# Patient Record
Sex: Female | Born: 1995 | Race: White | Hispanic: No | Marital: Single | Smoking: Never smoker
Health system: Southern US, Community
[De-identification: ages and names within clinical notes are randomized; demographics above are authoritative.]

## PROBLEM LIST (undated history)

## (undated) DIAGNOSIS — G930 Cerebral cysts: Secondary | ICD-10-CM

## (undated) DIAGNOSIS — R55 Syncope and collapse: Secondary | ICD-10-CM

## (undated) DIAGNOSIS — Z91410 Personal history of adult physical and sexual abuse: Secondary | ICD-10-CM

## (undated) DIAGNOSIS — J45909 Unspecified asthma, uncomplicated: Secondary | ICD-10-CM

## (undated) DIAGNOSIS — Z62810 Personal history of physical and sexual abuse in childhood: Secondary | ICD-10-CM

## (undated) DIAGNOSIS — G43109 Migraine with aura, not intractable, without status migrainosus: Secondary | ICD-10-CM

## (undated) HISTORY — PX: WISDOM TOOTH EXTRACTION: SHX21

## (undated) HISTORY — DX: Personal history of adult physical and sexual abuse: Z91.410

## (undated) HISTORY — DX: Migraine with aura, not intractable, without status migrainosus: G43.109

## (undated) HISTORY — DX: Cerebral cysts: G93.0

## (undated) HISTORY — DX: Personal history of physical and sexual abuse in childhood: Z62.810

---

## 2017-09-21 ENCOUNTER — Emergency Department (HOSPITAL_COMMUNITY)
Admission: EM | Admit: 2017-09-21 | Discharge: 2017-09-21 | Disposition: A | Discharge status: Home / Self Care | Attending: Emergency Medicine | Admitting: Emergency Medicine

## 2017-09-21 ENCOUNTER — Encounter (HOSPITAL_COMMUNITY): Payer: Self-pay

## 2017-09-21 DIAGNOSIS — R51 Headache: Secondary | ICD-10-CM | POA: Diagnosis not present

## 2017-09-21 DIAGNOSIS — J45909 Unspecified asthma, uncomplicated: Secondary | ICD-10-CM | POA: Insufficient documentation

## 2017-09-21 DIAGNOSIS — R202 Paresthesia of skin: Secondary | ICD-10-CM | POA: Diagnosis not present

## 2017-09-21 HISTORY — DX: Syncope and collapse: R55

## 2017-09-21 HISTORY — DX: Unspecified asthma, uncomplicated: J45.909

## 2017-09-21 LAB — COMPREHENSIVE METABOLIC PANEL
ALBUMIN: 3.8 g/dL (ref 3.5–5.0)
ALT: 17 U/L (ref 14–54)
AST: 26 U/L (ref 15–41)
Alkaline Phosphatase: 60 U/L (ref 38–126)
Anion gap: 7 (ref 5–15)
BUN: 12 mg/dL (ref 6–20)
CALCIUM: 9.1 mg/dL (ref 8.9–10.3)
CO2: 26 mmol/L (ref 22–32)
Chloride: 104 mmol/L (ref 101–111)
Creatinine, Ser: 0.77 mg/dL (ref 0.44–1.00)
GFR calc non Af Amer: >60 mL/min (ref >60)
GLUCOSE: 119 mg/dL — AB (ref 65–99)
POTASSIUM: 3.7 mmol/L (ref 3.5–5.1)
SODIUM: 137 mmol/L (ref 135–145)
Total Bilirubin: 0.9 mg/dL (ref 0.3–1.2)
Total Protein: 7 g/dL (ref 6.5–8.1)

## 2017-09-21 LAB — CBC WITH DIFFERENTIAL/PLATELET
BASOS PCT: 0 %
Basophils Absolute: 0 10*3/uL (ref 0.0–0.1)
EOS ABS: 0 10*3/uL (ref 0.0–0.7)
EOS PCT: 0 %
HCT: 38.5 % (ref 36.0–46.0)
Hemoglobin: 13.3 g/dL (ref 12.0–15.0)
LYMPHS ABS: 2.6 10*3/uL (ref 0.7–4.0)
Lymphocytes Relative: 30 %
MCH: 30.3 pg (ref 26.0–34.0)
MCHC: 34.5 g/dL (ref 30.0–36.0)
MCV: 87.7 fL (ref 78.0–100.0)
MONOS PCT: 8 %
Monocytes Absolute: 0.7 10*3/uL (ref 0.1–1.0)
NEUTROS PCT: 62 %
Neutro Abs: 5.3 10*3/uL (ref 1.7–7.7)
PLATELETS: 296 10*3/uL (ref 150–400)
RBC: 4.39 MIL/uL (ref 3.87–5.11)
RDW: 12.3 % (ref 11.5–15.5)
WBC: 8.6 10*3/uL (ref 4.0–10.5)

## 2017-09-21 LAB — I-STAT BETA HCG BLOOD, ED (MC, WL, AP ONLY): I-stat hCG, quantitative: <5 m[IU]/mL (ref <5)

## 2017-09-21 MED ORDER — DIPHENHYDRAMINE HCL 25 MG PO CAPS
ORAL_CAPSULE | ORAL | Status: AC
  Filled 2017-09-21: qty 1

## 2017-09-21 MED ORDER — HYDROMORPHONE HCL 2 MG/ML IJ SOLN
INTRAMUSCULAR | Status: AC
  Filled 2017-09-21: qty 1

## 2017-09-21 MED ORDER — KETOROLAC TROMETHAMINE 15 MG/ML IJ SOLN
INTRAMUSCULAR | Status: AC
  Filled 2017-09-21: qty 1

## 2017-09-21 NOTE — Discharge Instructions (Signed)
The cause for your numbness is not clear. It could be related to your headaches, hyperventilation, or Multiple Sclerosis. You should see a neurologist for further evaluation.

## 2017-09-21 NOTE — ED Notes (Signed)
Bed: [id number] Expected date:  Expected time:  Means of arrival:  Comments: Room 5

## 2017-09-21 NOTE — ED Triage Notes (Signed)
Pt presents with c/o right arm numbness and right side of the face numbness that has not resolved. Pt is tachycardic on arrival, 127 BPM on the monitor. Pt reports this has happened before but not to this extent. Pt is alert and oriented, no neuro deficits noted.

## 2017-09-21 NOTE — ED Provider Notes (Signed)
[facility]-EMERGENCY DEPT Provider Note   CSN: [id number] Arrival date & time: 09/21/17  0144     History   Chief Complaint Chief Complaint  Patient presents with  . Numbness    HPI Lindsey Benson is a 21 y.o. female.  The history is provided by the patient.  She has history of asthma and vasovagal syncope.  At 9 PM, she developed numbness in the right arm which spread up to involve the right side of her face.  Her fingers in the right hand started drawing together.  There is no numbness involving the right leg or anything on the left side.  Symptoms lasted about 15 minutes before resolving.  She did have a headache after symptoms resolved.  Currently all symptoms have completely resolved.  She has had 2 prior episodes and each of those was followed by a headache which is more typical of her migraine headaches.  The headache today was less severe than her usual headaches.  She does have history of panic attacks and she states she did have a panic attack about 1 hour before the episode tonight.  During that panic attacks she was hyperventilating.  This attack occurred while she was at rest and not agitated at all.  Past Medical History:  Diagnosis Date  . Asthma   . Vasovagal syncope     There are no active problems to display for this patient.   Past Surgical History:  Procedure Laterality Date  . WISDOM TOOTH EXTRACTION      OB History    No data available       Home Medications    Prior to Admission medications   Not on File    Family History History reviewed. No pertinent family history.  Social History Social History   Tobacco Use  . Smoking status: Never Smoker  . Smokeless tobacco: Never Used  Substance Use Topics  . Alcohol use: No    Frequency: Never  . Drug use: No     Allergies   Pork-derived products   Review of Systems Review of Systems  All other systems reviewed and are negative.    Physical Exam Updated  Vital Signs BP (!) 128/97 (BP Location: Left Arm)   Pulse (!) 120   Temp 98.1 F (36.7 C) (Oral)   Resp 13   Ht 5\' 4"  (1.626 m)   Wt 48.1 kg (106 lb)   LMP 08/21/2017 (Approximate)   SpO2 100%   BMI 18.19 kg/m   Physical Exam  Nursing note and vitals reviewed.  21 year old female, resting comfortably and in no acute distress. Vital signs are significant for tachycardia. Oxygen saturation is 100%, which is normal. Head is normocephalic and atraumatic. PERRLA, EOMI. Oropharynx is clear. Neck is nontender and supple without adenopathy or JVD.  There are no carotid bruits. Back is nontender and there is no CVA tenderness. Lungs are clear without rales, wheezes, or rhonchi. Chest is nontender. Heart has regular rate and rhythm without murmur. Abdomen is soft, flat, nontender without masses or hepatosplenomegaly and peristalsis is normoactive. Extremities have no cyanosis or edema, full range of motion is present. Skin is warm and dry without rash. Neurologic: Mental status is normal, cranial nerves are intact, there are no motor or sensory deficits.  ED Treatments / Results  Labs (all labs ordered are listed, but only abnormal results are displayed) Labs Reviewed  COMPREHENSIVE METABOLIC PANEL - Abnormal; Notable for the following components:  Result Value   Glucose, Bld 119 (*)    All other components within normal limits  CBC WITH DIFFERENTIAL/PLATELET  I-STAT BETA HCG BLOOD, ED ([facility], [facility], [facility] ONLY)    EKG  EKG Interpretation  Date/Time:  Saturday September 21 2017 02:03:34 EST Ventricular Rate:  123 PR Interval:    QRS Duration: 76 QT Interval:  297 QTC Calculation: 425 R Axis:   87 Text Interpretation:  Sinus tachycardia Consider right atrial enlargement Minimal ST depression, diffuse leads No old tracing to compare Confirmed by Dione BoozeGlick, Tyaire Odem ([id number]) on 09/21/2017 2:12:52 AM       Procedures Procedures (including critical care time)  Medications Ordered in  ED Medications - No data to display   Initial Impression / Assessment and Plan / ED Course  I have reviewed the triage vital signs and the nursing notes.  Pertinent labs & imaging results that were available during my care of the patient were reviewed by me and considered in my medical decision making (see chart for details).  Transient right-sided numbness with what sounds like carpopedal spasm.  This could be related to her headaches-essentially an aura of a migraine headache.  Consider possibility of occult hyperventilation.  Consider multiple sclerosis.  Patient has a friend with her who was also worried about transient ischemic attack.  I explained that this is very unlikely in a patient of this age with out other comorbidities.  She is a non-smoker and states that she has had an echocardiogram in the past which was normal and has had brain MRIs which were normal.  At this point, she is 6hours after resolution of symptoms and I do not see an indication for imaging today.  Will check screening labs and refer to neurology for outpatient workup which may need to include brain MRI with and without contrast.  She has no old records in the [facility].  Screening labs are unremarkable.  Heart rate is come back down to normal without any fluids or medication.  Patient is given reassurance and is referred to neurology for follow-up.  Her insurance is through [employer] and she has a Development worker, communityphysician in [place].  She states she may try to see a neurologist down there.  Final Clinical Impressions(s) / ED Diagnoses   Final diagnoses:  Paresthesia of right arm    ED Discharge Orders    None       Dione BoozeGlick, Rocio Roam, MD 09/21/17 [phone]

## 2017-09-21 NOTE — ED Notes (Signed)
ED Provider at bedside. 

## 2017-09-21 NOTE — ED Notes (Signed)
Bed: WA05 Expected date:  Expected time:  Means of arrival:  Comments: 

## 2017-09-21 NOTE — ED Notes (Signed)
Pt states she had 15 min of arm cramping and numbness and face numbness. Pt states symptoms resolved. No noted deficits at time of assessment. Pt states she had a panic attack secondary to stress from finals at school about an hour prior to symptoms. Pt states she did not have dinner, but she had a small lunch around 1500.

## 2018-01-10 ENCOUNTER — Emergency Department (HOSPITAL_COMMUNITY)

## 2018-01-10 ENCOUNTER — Emergency Department (HOSPITAL_COMMUNITY)
Admission: EM | Admit: 2018-01-10 | Discharge: 2018-01-10 | Disposition: A | Discharge status: Home / Self Care | Attending: Emergency Medicine | Admitting: Emergency Medicine

## 2018-01-10 ENCOUNTER — Encounter (HOSPITAL_COMMUNITY): Payer: Self-pay | Admitting: Emergency Medicine

## 2018-01-10 DIAGNOSIS — R202 Paresthesia of skin: Secondary | ICD-10-CM | POA: Insufficient documentation

## 2018-01-10 DIAGNOSIS — R200 Anesthesia of skin: Secondary | ICD-10-CM | POA: Diagnosis present

## 2018-01-10 LAB — I-STAT CHEM 8, ED
BUN: 11 mg/dL (ref 6–20)
CREATININE: 0.6 mg/dL (ref 0.44–1.00)
Calcium, Ion: 1.2 mmol/L (ref 1.15–1.40)
Chloride: 103 mmol/L (ref 101–111)
GLUCOSE: 113 mg/dL — AB (ref 65–99)
HCT: 39 % (ref 36.0–46.0)
HEMOGLOBIN: 13.3 g/dL (ref 12.0–15.0)
Potassium: 3.5 mmol/L (ref 3.5–5.1)
Sodium: 140 mmol/L (ref 135–145)
TCO2: 26 mmol/L (ref 22–32)

## 2018-01-10 LAB — I-STAT BETA HCG BLOOD, ED (MC, WL, AP ONLY)

## 2018-01-10 MED ORDER — GADOBENATE DIMEGLUMINE 529 MG/ML IV SOLN
10.0000 mL | Freq: Once | INTRAVENOUS | Status: AC | PRN
  Administered 2018-01-10: 9 mL via INTRAVENOUS

## 2018-01-10 NOTE — ED Notes (Signed)
No neuro deficits at this time

## 2018-01-10 NOTE — ED Provider Notes (Signed)
[facility]-EMERGENCY DEPT Provider Note   CSN: [id number] Arrival date & time: 01/10/18  0905     History   Chief Complaint Chief Complaint  Patient presents with  . hand numbess    HPI Lindsey Benson is a 22 y.o. female.  HPI Patient is a healthy 22 year old female without significant past medical history who presents the emergency department because of developing numbness and paresthesias in her left hand and her left forearm as well as an abnormal sensation across both of her jaws left greater than right.  She states this occurred while she was in class today at the [employer].  She came immediately to the emergency department and reports that after approximately 50 minutes her symptoms have resolved.  She did awake with some mild headache this morning and has had similar symptoms before in the past.  At the end of February 2019 she had similar symptoms although at that time her symptoms were in her right hand and was seen in the emergency department and worked up with laboratory studies.  She is seen outpatient neurology at [facility] and underwent EEG which was normal.  Her neurologist did want to perform an MRI.  Her neurologist has started her on Imitrex for possible complex migraines.  At this time she is without symptoms.  She feels much better.   Past Medical History:  Diagnosis Date  . Asthma   . Vasovagal syncope     There are no active problems to display for this patient.   Past Surgical History:  Procedure Laterality Date  . WISDOM TOOTH EXTRACTION      OB History    No data available       Home Medications    Prior to Admission medications   Medication Sig Start Date End Date Taking? Authorizing Provider  acetaminophen (TYLENOL) 500 MG tablet Take 500 mg by mouth every 6 (six) hours as needed for mild pain.   Yes [provider]  polyethylene glycol (MIRALAX / GLYCOLAX) packet Take 17 g by mouth daily.   Yes [provider]  SUMAtriptan (IMITREX) 100 MG tablet Take 100 mg by mouth every 2 (two) hours as needed for migraine.  12/24/17  Yes [provider]  Burr Medico 150-35 MCG/24HR transdermal patch Place 1 patch onto the skin once a week.  10/31/17  Yes [provider]    Family History No family history on file.  Social History Social History   Tobacco Use  . Smoking status: Never Smoker  . Smokeless tobacco: Never Used  Substance Use Topics  . Alcohol use: No    Frequency: Never  . Drug use: No     Allergies   Pork-derived products   Review of Systems Review of Systems  All other systems reviewed and are negative.    Physical Exam Updated Vital Signs BP 114/72 (BP Location: Left Arm)   Pulse 96   Temp 99.7 F (37.6 C) (Oral)   Resp 12   LMP 01/04/2018   SpO2 98%   Physical Exam  Constitutional: She is oriented to person, place, and time. She appears well-developed and well-nourished. No distress.  HENT:  Head: Normocephalic and atraumatic.  Eyes: EOM are normal. Pupils are equal, round, and reactive to light.  Neck: Normal range of motion.  Cardiovascular: Normal rate, regular rhythm and normal heart sounds.  Pulmonary/Chest: Effort normal and breath sounds normal.  Abdominal: Soft. She exhibits no distension. There is no tenderness.  Musculoskeletal: Normal  range of motion.  Neurological: She is alert and oriented to person, place, and time.  5/5 strength in major muscle groups of  bilateral upper and lower extremities. Speech normal. No facial asymetry.   Skin: Skin is warm and dry.  Psychiatric: She has a normal mood and affect. Judgment normal.  Nursing note and vitals reviewed.    ED Treatments / Results  Labs (all labs ordered are listed, but only abnormal results are displayed) Labs Reviewed  I-STAT CHEM 8, ED - Abnormal; Notable for the following components:      Result Value   Glucose, Bld 113 (*)    All other components within  normal limits  I-STAT BETA HCG BLOOD, ED ([facility], [facility], [facility] ONLY)    EKG  EKG Interpretation None       Radiology No results found.  Procedures Procedures (including critical care time)  Medications Ordered in ED Medications - No data to display   Initial Impression / Assessment and Plan / ED Course  I have reviewed the triage vital signs and the nursing notes.  Pertinent labs & imaging results that were available during my care of the patient were reviewed by me and considered in my medical decision making (see chart for details).     Asymptomatic in the emergency department at time of my evaluation.  Normal neurologic exam.  Normal finger-nose and strength bilaterally.  As the outpatient neurologist wants to complete an MRI think this will help benefit her outpatient workup.  MRI to be completed here in the emergency department.  May still represent comp gated migraines.  If her MRI is negative in the emergency department I do not think she needs additional workup or acute hospitalization at this time.  At that point she will be stable for discharge from the emergency department with close outpatient neurology follow-up.  She will be sent with a copy of her MRI for follow-up purposes with her neurologist.  Care to Dr Madilyn Hookees to follow up on MRI  Final Clinical Impressions(s) / ED Diagnoses   Final diagnoses:  None    ED Discharge Orders    None       Azalia Bilisampos, Amari Burnsworth, MD 01/10/18 1616

## 2018-01-10 NOTE — ED Notes (Signed)
Pt and boyfriend are very concerned that patient had a stroke. Pt states that this is the 2nd time in 3 months that she has had her body become numb. Pt states in the past, it was her right side, today, it was her left hand. Pt states that she attempted to perform a stroke scale on herself, and that she was unable to smile and her left arm felt different than her right. Pt and boyfriend upset that pt waited 3 hours in the lobby. Pt states that she is afraid she had a TIA and no one is taking her seriously.

## 2018-01-10 NOTE — ED Triage Notes (Signed)
Pt reports that this morning when she woke up she felt weird so went ahead and took her migraine medications. Then started feeling numb on left hand and breathing felt like couldnt get a full breath and then left hand cramped up and pain spread to up left arm to elbow. Reports that her mouth felt numb and then after numbness went away then turned to pain. Reports that she had these symptoms before and was seen here and new test for STROKE, so she smiled and left face was numb and pushed against desk with her arms and left side was weaker. Patient reports that she saw neurologist after being seen here before.

## 2018-01-10 NOTE — ED Provider Notes (Signed)
Pt here for paresthesia to LUE - now resolved. MRI does note some small lesions of unclear significance. Discuss with patient findings of MRI. Discussed importance of neurology follow-up for further evaluation and treatment. Return precautions discussed.   Tilden Fossaees, Briton Sellman, MD 01/11/18 [id number]

## 2018-01-10 NOTE — Discharge Instructions (Signed)
Please follow up with your Neurologist for further evaluation.  Get rechecked immediately if you have new or concerning symptoms.    EXAM: MRI HEAD WITHOUT AND WITH CONTRAST   TECHNIQUE: Multiplanar, multiecho pulse sequences of the brain and surrounding structures were obtained without and with intravenous contrast.   CONTRAST:  10mL MULTIHANCE GADOBENATE DIMEGLUMINE 529 MG/ML IV SOLN   COMPARISON:  None.   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 4 small focus of T2 FLAIR hyperintense signal abnormality in bilateral frontal subcortical as well as juxta cortical white matter and right periatrial white matter (series 7: Image 59, series 9: Image 16 and 21, and series 9, image 12). No lesion identified within corpus callosum, basal ganglia, or posterior fossa.   No abnormal enhancement. No reduced diffusion to suggest acute or early subacute infarction. No susceptibility hypointensity to indicate intracranial hemorrhage. No hydrocephalus, extra-axial collection, or effacement of basilar cisterns.   Vascular: Normal flow voids.   Skull and upper cervical spine: Normal marrow signal.   Sinuses/Orbits: Negative.   Other: None.   IMPRESSION: 1. 4 small nonspecific white matter lesions in juxta cortical, subcortical, and periventricular white matter unexpected for age. No enhancement or reduced diffusion to suggest an active process. Findings may represent multiple sclerosis, but do not satisfy revised McDonald imaging criteria for multiple sclerosis without enhancement. Differential includes sequelae of migraine headache or other causes of inflammation such as NMO, Lyme disease, lupus, or vasculitis. Follow-up is recommended. 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 One year follow-up with MRI of the brain is recommended to ensure stability.

## 2018-04-08 ENCOUNTER — Encounter: Payer: Self-pay | Admitting: Obstetrics and Gynecology

## 2018-04-08 ENCOUNTER — Other Ambulatory Visit (HOSPITAL_COMMUNITY)
Admission: RE | Admit: 2018-04-08 | Discharge: 2018-04-08 | Disposition: A | Discharge status: Home / Self Care | Source: Ambulatory Visit | Attending: Obstetrics and Gynecology | Admitting: Obstetrics and Gynecology

## 2018-04-08 ENCOUNTER — Other Ambulatory Visit: Payer: Self-pay

## 2018-04-08 ENCOUNTER — Ambulatory Visit (INDEPENDENT_AMBULATORY_CARE_PROVIDER_SITE_OTHER): Admitting: Obstetrics and Gynecology

## 2018-04-08 VITALS — BP 100/60 | HR 76 | Resp 16 | Ht 64.0 in | Wt 104.0 lb

## 2018-04-08 DIAGNOSIS — G43109 Migraine with aura, not intractable, without status migrainosus: Secondary | ICD-10-CM | POA: Diagnosis not present

## 2018-04-08 DIAGNOSIS — Z01419 Encounter for gynecological examination (general) (routine) without abnormal findings: Secondary | ICD-10-CM | POA: Diagnosis not present

## 2018-04-08 DIAGNOSIS — N946 Dysmenorrhea, unspecified: Secondary | ICD-10-CM

## 2018-04-08 DIAGNOSIS — Z91410 Personal history of adult physical and sexual abuse: Secondary | ICD-10-CM | POA: Diagnosis not present

## 2018-04-08 DIAGNOSIS — Z113 Encounter for screening for infections with a predominantly sexual mode of transmission: Secondary | ICD-10-CM

## 2018-04-08 DIAGNOSIS — N941 Unspecified dyspareunia: Secondary | ICD-10-CM | POA: Diagnosis not present

## 2018-04-08 DIAGNOSIS — Z62810 Personal history of physical and sexual abuse in childhood: Secondary | ICD-10-CM | POA: Diagnosis not present

## 2018-04-08 MED ORDER — NORETHINDRONE 0.35 MG PO TABS: 1.0000 | ORAL_TABLET | Freq: Every day | ORAL | 2 refills | Status: DC
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
## 2018-04-08 NOTE — Patient Instructions (Signed)
EXERCISE AND DIET:  We recommended that you start or continue a regular exercise program for good health. Regular exercise means any activity that makes your heart beat faster and makes you sweat.  We recommend exercising at least 30 minutes per day at least 3 days a week, preferably 4 or 5.  We also recommend a diet low in fat and sugar.  Inactivity, poor dietary choices and obesity can cause diabetes, heart attack, stroke, and kidney damage, among others.    ALCOHOL AND SMOKING:  Women should limit their alcohol intake to no more than 7 drinks/beers/glasses of wine (combined, not each!) per week. Moderation of alcohol intake to this level decreases your risk of breast cancer and liver damage. And of course, no recreational drugs are part of a healthy lifestyle.  And absolutely no smoking or even second hand smoke. Most people know smoking can cause heart and lung diseases, but did you know it also contributes to weakening of your bones? Aging of your skin?  Yellowing of your teeth and nails?  CALCIUM AND VITAMIN D:  Adequate intake of calcium and Vitamin D are recommended.  The recommendations for exact amounts of these supplements seem to change often, but generally speaking 600 mg of calcium (either carbonate or citrate) and 800 units of Vitamin D per day seems prudent. Certain women may benefit from higher intake of Vitamin D.  If you are among these women, your doctor will have told you during your visit.    PAP SMEARS:  Pap smears, to check for cervical cancer or precancers,  have traditionally been done yearly, although recent scientific advances have shown that most women can have pap smears less often.  However, every woman still should have a physical exam from her gynecologist every year. It will include a breast check, inspection of the vulva and vagina to check for abnormal growths or skin changes, a visual exam of the cervix, and then an exam to evaluate the size and shape of the uterus and  ovaries.  And after 21 years of age, a rectal exam is indicated to check for rectal cancers. We will also provide age appropriate advice regarding health maintenance, like when you should have certain vaccines, screening for sexually transmitted diseases, bone density testing, colonoscopy, mammograms, etc.   MAMMOGRAMS:  All women over 40 years old should have a yearly mammogram. Many facilities now offer a "3D" mammogram, which may cost around $50 extra out of pocket. If possible,  we recommend you accept the option to have the 3D mammogram performed.  It both reduces the number of women who will be called back for extra views which then turn out to be normal, and it is better than the routine mammogram at detecting truly abnormal areas.    COLONOSCOPY:  Colonoscopy to screen for colon cancer is recommended for all women at age 50.  We know, you hate the idea of the prep.  We agree, BUT, having colon cancer and not knowing it is worse!!  Colon cancer so often starts as a polyp that can be seen and removed at colonscopy, which can quite literally save your life!  And if your first colonoscopy is normal and you have no family history of colon cancer, most women don't have to have it again for 10 years.  Once every ten years, you can do something that may end up saving your life, right?  We will be happy to help you get it scheduled when you are ready.    Be sure to check your insurance coverage so you understand how much it will cost.  It may be covered as a preventative service at no cost, but you should check your particular policy.     Norethindrone tablets (contraception) What is this medicine? NORETHINDRONE (nor eth IN drone) is an oral contraceptive. The product contains a female hormone known as a progestin. It is used to prevent pregnancy. This medicine may be used for other purposes; ask your health care provider or pharmacist if you have questions. COMMON BRAND NAME(S): Camila, Deblitane 28-Day,  Errin, Heather, Jencycla, Jolivette, Lyza, Nor-QD, Nora-BE, Norlyroc, Ortho Micronor, Sharobel 28-Day What should I tell my health care provider before I take this medicine? They need to know if you have any of these conditions: -blood vessel disease or blood clots -breast, cervical, or vaginal cancer -diabetes -heart disease -kidney disease -liver disease -mental depression -migraine -seizures -stroke -vaginal bleeding -an unusual or allergic reaction to norethindrone, other medicines, foods, dyes, or preservatives -pregnant or trying to get pregnant -breast-feeding How should I use this medicine? Take this medicine by mouth with a glass of water. You may take it with or without food. Follow the directions on the prescription label. Take this medicine at the same time each day and in the order directed on the package. Do not take your medicine more often than directed. Contact your pediatrician regarding the use of this medicine in children. Special care may be needed. This medicine has been used in female children who have started having menstrual periods. A patient package insert for the product will be given with each prescription and refill. Read this sheet carefully each time. The sheet may change frequently. Overdosage: If you think you have taken too much of this medicine contact a poison control center or emergency room at once. NOTE: This medicine is only for you. Do not share this medicine with others. What if I miss a dose? Try not to miss a dose. Every time you miss a dose or take a dose late your chance of pregnancy increases. When 1 pill is missed (even if only 3 hours late), take the missed pill as soon as possible and continue taking a pill each day at the regular time (use a back up method of birth control for the next 48 hours). If more than 1 dose is missed, use an additional birth control method for the rest of your pill pack until menses occurs. Contact your health care  professional if more than 1 dose has been missed. What may interact with this medicine? Do not take this medicine with any of the following medications: -amprenavir or fosamprenavir -bosentan This medicine may also interact with the following medications: -antibiotics or medicines for infections, especially rifampin, rifabutin, rifapentine, and griseofulvin, and possibly penicillins or tetracyclines -aprepitant -barbiturate medicines, such as phenobarbital -carbamazepine -felbamate -modafinil -oxcarbazepine -phenytoin -ritonavir or other medicines for HIV infection or AIDS -St. John's wort -topiramate This list may not describe all possible interactions. Give your health care provider a list of all the medicines, herbs, non-prescription drugs, or dietary supplements you use. Also tell them if you smoke, drink alcohol, or use illegal drugs. Some items may interact with your medicine. What should I watch for while using this medicine? Visit your doctor or health care professional for regular checks on your progress. You will need a regular breast and pelvic exam and Pap smear while on this medicine. Use an additional method of birth control during the first cycle that   you take these tablets. If you have any reason to think you are pregnant, stop taking this medicine right away and contact your doctor or health care professional. If you are taking this medicine for hormone related problems, it may take several cycles of use to see improvement in your condition. This medicine does not protect you against HIV infection (AIDS) or any other sexually transmitted diseases. What side effects may I notice from receiving this medicine? Side effects that you should report to your doctor or health care professional as soon as possible: -breast tenderness or discharge -pain in the abdomen, chest, groin or leg -severe headache -skin rash, itching, or hives -sudden shortness of breath -unusually weak  or tired -vision or speech problems -yellowing of skin or eyes Side effects that usually do not require medical attention (report to your doctor or health care professional if they continue or are bothersome): -changes in sexual desire -change in menstrual flow -facial hair growth -fluid retention and swelling -headache -irritability -nausea -weight gain or loss This list may not describe all possible side effects. Call your doctor for medical advice about side effects. You may report side effects to FDA at 1-800-FDA-1088. Where should I keep my medicine? Keep out of the reach of children. Store at room temperature between 15 and 30 degrees C (59 and 86 degrees F). Throw away any unused medicine after the expiration date. NOTE: This sheet is a summary. It may not cover all possible information. If you have questions about this medicine, talk to your doctor, pharmacist, or health care provider.  2018 Elsevier/Gold Standard (2012-07-04 16:41:35)  

## 2018-04-09 LAB — HEPATITIS C ANTIBODY: Hep C Virus Ab: <0.1 {s_co_ratio} (ref 0.0–0.9)

## 2018-04-09 LAB — HEP, RPR, HIV PANEL
HIV Screen 4th Generation wRfx: NONREACTIVE
Hepatitis B Surface Ag: NEGATIVE
RPR Ser Ql: NONREACTIVE

## 2018-04-10 ENCOUNTER — Encounter: Payer: Self-pay | Admitting: Obstetrics and Gynecology

## 2018-04-10 DIAGNOSIS — Z62810 Personal history of physical and sexual abuse in childhood: Secondary | ICD-10-CM | POA: Insufficient documentation

## 2018-04-10 DIAGNOSIS — Z91410 Personal history of adult physical and sexual abuse: Secondary | ICD-10-CM | POA: Insufficient documentation

## 2018-04-10 LAB — CYTOLOGY - PAP
Chlamydia: NEGATIVE
DIAGNOSIS: NEGATIVE
Neisseria Gonorrhea: NEGATIVE
TRICH (WINDOWPATH): NEGATIVE

## 2018-04-17 ENCOUNTER — Encounter: Payer: Self-pay | Admitting: Obstetrics and Gynecology

## 2018-04-17 ENCOUNTER — Other Ambulatory Visit

## 2018-04-17 ENCOUNTER — Ambulatory Visit (INDEPENDENT_AMBULATORY_CARE_PROVIDER_SITE_OTHER)

## 2018-04-17 ENCOUNTER — Other Ambulatory Visit: Payer: Self-pay | Admitting: Obstetrics and Gynecology

## 2018-04-17 ENCOUNTER — Ambulatory Visit (INDEPENDENT_AMBULATORY_CARE_PROVIDER_SITE_OTHER): Admitting: Obstetrics and Gynecology

## 2018-04-17 VITALS — BP 100/68 | HR 68 | Resp 18 | Ht 64.0 in | Wt 104.0 lb

## 2018-04-17 DIAGNOSIS — N941 Unspecified dyspareunia: Secondary | ICD-10-CM

## 2018-04-17 DIAGNOSIS — N946 Dysmenorrhea, unspecified: Secondary | ICD-10-CM

## 2018-04-17 NOTE — Patient Instructions (Addendum)
You may consider Dewayne HatchOrilissa or Depo Lupron in the future if needed to control pain.    Endometriosis Endometriosis is a condition in which the tissue that lines the uterus (endometrium) grows outside of its normal location. The tissue may grow in many locations close to the uterus, but it commonly grows on the ovaries, fallopian tubes, vagina, or bowel. When the uterus sheds the endometrium every menstrual cycle, there is bleeding wherever the endometrial tissue is located. This can cause pain because blood is irritating to tissues that are not normally exposed to it. What are the causes? The cause of endometriosis is not known. What increases the risk? You may be more likely to develop endometriosis if you:  Have a family history of endometriosis.  Have never given birth.  Started your period at age 21 or younger.  Have high levels of estrogen in your body.  Were exposed to a certain medicine (diethylstilbestrol) before you were born (in utero).  Had low birth weight.  Were born as a twin, triplet, or other multiple.  Have a BMI of less than 25. BMI is an estimate of body fat and is calculated from height and weight.  What are the signs or symptoms? Often, there are no symptoms of this condition. If you do have symptoms, they may:  Vary depending on where your endometrial tissue is growing.  Occur during your menstrual period (most common) or midcycle.  Come and go, or you may go months with no symptoms at all.  Stop with menopause.  Symptoms may include:  Pain in the back or abdomen.  Heavier bleeding during periods.  Pain during sex.  Painful bowel movements.  Infertility.  Pelvic pain.  Bleeding more than once a month.  How is this diagnosed? This condition is diagnosed based on your symptoms and a physical exam. You may have tests, such as:  Blood tests and urine tests. These may be done to help rule out other possible causes of your symptoms.  Ultrasound,  to look for abnormal tissues.  An X-ray of the lower bowel (barium enema).  An ultrasound that is done through the vagina (transvaginally).  CT scan.  MRI.  Laparoscopy. In this procedure, a lighted, pencil-sized instrument called a laparoscope is inserted into your abdomen through an incision. The laparoscope allows your health care provider to look at the organs inside your body and check for abnormal tissue to confirm the diagnosis. If abnormal tissue is found, your health care provider may remove a small piece of tissue (biopsy) to be examined under a microscope.  How is this treated? Treatment for this condition may include:  Medicines to relieve pain, such as NSAIDs.  Hormone therapy. This involves using artificial (synthetic) hormones to reduce endometrial tissue growth. Your health care provider may recommend using a hormonal form of birth control, or other medicines.  Surgery. This may be done to remove abnormal endometrial tissue. ? In some cases, tissue may be removed using a laparoscope and a laser (laparoscopic laser treatment). ? In severe cases, surgery may be done to remove the fallopian tubes, uterus, and ovaries (hysterectomy).  Follow these instructions at home:  Take over-the-counter and prescription medicines only as told by your health care provider.  Do not drive or use heavy machinery while taking prescription pain medicine.  Try to avoid activities that cause pain, including sexual activity.  Keep all follow-up visits as told by your health care provider. This is important. Contact a health care provider if:  You have pain in the area between your hip bones (pelvic area) that occurs: ? Before, during, or after your period. ? In between your period and gets worse during your period. ? During or after sex. ? With bowel movements or urination, especially during your period.  You have problems getting pregnant.  You have a fever. Get help right away  if:  You have severe pain that does not get better with medicine.  You have severe nausea and vomiting, or you cannot eat without vomiting.  You have pain that affects only the lower, right side of your abdomen.  You have abdominal pain that gets worse.  You have abdominal swelling.  You have blood in your stool. This information is not intended to replace advice given to you by your health care provider. Make sure you discuss any questions you have with your health care provider. Document Released: 10/12/2000 Document Revised: 07/20/2016 Document Reviewed: 03/17/2016 Elsevier Interactive Patient Education  [employer].
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
## 2018-06-09 ENCOUNTER — Ambulatory Visit: Admitting: Obstetrics and Gynecology

## 2018-06-13 ENCOUNTER — Other Ambulatory Visit: Payer: Self-pay

## 2018-06-13 ENCOUNTER — Ambulatory Visit (INDEPENDENT_AMBULATORY_CARE_PROVIDER_SITE_OTHER): Admitting: Obstetrics and Gynecology

## 2018-06-13 ENCOUNTER — Encounter: Payer: Self-pay | Admitting: Obstetrics and Gynecology

## 2018-06-13 VITALS — BP 106/72 | HR 70 | Resp 14 | Ht 64.0 in | Wt 104.2 lb

## 2018-06-13 DIAGNOSIS — N946 Dysmenorrhea, unspecified: Secondary | ICD-10-CM

## 2018-06-13 DIAGNOSIS — F439 Reaction to severe stress, unspecified: Secondary | ICD-10-CM | POA: Diagnosis not present

## 2018-06-13 DIAGNOSIS — R6882 Decreased libido: Secondary | ICD-10-CM | POA: Diagnosis not present

## 2018-06-13 DIAGNOSIS — N941 Unspecified dyspareunia: Secondary | ICD-10-CM

## 2018-06-13 MED ORDER — NORETHINDRONE 0.35 MG PO TABS: 1.0000 | ORAL_TABLET | Freq: Every day | ORAL | 2 refills | Status: DC
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
## 2018-11-26 ENCOUNTER — Telehealth: Payer: Self-pay | Admitting: Obstetrics and Gynecology

## 2018-11-26 ENCOUNTER — Encounter: Payer: Self-pay | Admitting: Obstetrics and Gynecology

## 2018-11-26 NOTE — Telephone Encounter (Signed)
Message left to return call to Triage Nurse at [phone].    

## 2018-11-26 NOTE — Telephone Encounter (Signed)
Patient is returning a call to Triage °

## 2018-11-26 NOTE — Telephone Encounter (Signed)
Patient sent the following correspondence through MyChart. Routing to triage to assist patient with request.  The last time I came in, I switched to the [place] birth control pill. We talked about how sex is painful and uncomfortable for me, and you said to try out that birth control for a while. I have not noticed any changes while taking this birth control. I remember you mentioning possible Endometriosis and that there was a medication I could take for 6 months that might be helpful. I am interested in trying that out. Should I get a referral from my primary care manager and make an appointment? Thanks.

## 2018-11-26 NOTE — Telephone Encounter (Signed)
Returned call to patient. Patient states that she has [employer] insurance, so she needs a referral for "everything I have done." Patient states she will contact her Primary Care Manager to send referral for appointment. Appointment scheduled for Wednesday 12-17-2018 at 1530. Patient agreeable to date and time of appointment.   Routing to provider and will close encounter.   Cc Soundra Pilonosa Davis

## 2018-12-01 ENCOUNTER — Encounter: Payer: Self-pay | Admitting: Obstetrics and Gynecology

## 2018-12-01 ENCOUNTER — Telehealth: Payer: Self-pay | Admitting: Obstetrics and Gynecology

## 2018-12-01 NOTE — Telephone Encounter (Signed)
Ok to send records to PCP for them to approve referral.  Please follow HIPPA guidelines, as usual, for sending records.

## 2018-12-01 NOTE — Telephone Encounter (Signed)
My insurance is very strict when it comes to giving out referrals to see specialists. I tried to request a referral from my Primary Care Manager to make an appointment with you regarding the issues we discussed. She will not give me a referral unless she feels that it is necessary. She requested to have progress notes and visit notes faxed to her before she will write me a referral. Are you able to fax those to [phone]? Thank you.

## 2018-12-01 NOTE — Telephone Encounter (Signed)
This patient has a Scientist, physiological) and will need referral from PCP to return to care with Dr. Edward Jolly.   Pt is requesting her office visit notes be faxed to her PCP at below number.  Main number [phone].   Since patient is requesting these records and is established patient with Dr. Edward Jolly records can be sent.   Routing to Dr. Edward Jolly to review.

## 2018-12-02 NOTE — Telephone Encounter (Signed)
Records faxed as requested.

## 2018-12-02 NOTE — Telephone Encounter (Signed)
Routing to [place], please process records request and contact patient.  Thank you.

## 2018-12-17 ENCOUNTER — Ambulatory Visit: Admitting: Obstetrics and Gynecology

## 2018-12-19 ENCOUNTER — Ambulatory Visit (INDEPENDENT_AMBULATORY_CARE_PROVIDER_SITE_OTHER): Admitting: Obstetrics and Gynecology

## 2018-12-19 ENCOUNTER — Ambulatory Visit: Admitting: Obstetrics and Gynecology

## 2018-12-19 ENCOUNTER — Telehealth: Payer: Self-pay | Admitting: Obstetrics and Gynecology

## 2018-12-19 ENCOUNTER — Other Ambulatory Visit: Payer: Self-pay

## 2018-12-19 ENCOUNTER — Encounter: Payer: Self-pay | Admitting: Obstetrics and Gynecology

## 2018-12-19 VITALS — BP 100/64 | HR 76 | Ht 64.0 in | Wt 101.6 lb

## 2018-12-19 DIAGNOSIS — R102 Pelvic and perineal pain: Secondary | ICD-10-CM

## 2018-12-19 DIAGNOSIS — N946 Dysmenorrhea, unspecified: Secondary | ICD-10-CM | POA: Diagnosis not present

## 2018-12-19 DIAGNOSIS — N941 Unspecified dyspareunia: Secondary | ICD-10-CM

## 2018-12-19 NOTE — Patient Instructions (Addendum)
Your Dewayne Hatch would be 200 mg by mouth twice a day for 6 months.     Elagolix tablets What is this medicine? ELAGOLIX (el a GOE lix) is used to treat endometriosis in women. It reduces pain from the condition and may help reduce painful sexual intercourse. This medicine may be used for other purposes; ask your health care provider or pharmacist if you have questions. COMMON BRAND NAME(S): Dewayne Hatch What should I tell my health care provider before I take this medicine? They need to know if you have any of these conditions: -liver disease -mental illness -osteoporosis -suicidal thoughts, plans, or attempt; a previous suicide attempt by you or a family member -an unusual or allergic reaction to elagolix, other medicines, foods, dyes, or preservatives -pregnant or trying to get pregnant -breast-feeding How should I use this medicine? Take this medicine by mouth with a glass of water. You can take it with or without food. Follow the directions on the prescription label. Take this medicine at the same time each day. Do not take your medicine more often than directed. A special MedGuide will be given to you by the pharmacist with each prescription and refill. Be sure to read this information carefully each time. Talk to your pediatrician regarding the use of this medicine in children. This medicine is not approved for use in children. Overdosage: If you think you have taken too much of this medicine contact a poison control center or emergency room at once. NOTE: This medicine is only for you. Do not share this medicine with others. What if I miss a dose? If you miss a dose, take it as soon as you can. If it is almost time for your next dose, take only that dose. Do not take double or extra doses. What may interact with this medicine? Do not take this medicine with any of the following medications: -cyclosporine -gemfibrozil This medicine may also interact with the following  medications: -certain antiviral medicines for hepatitis, HIV or AIDS -citalopram -digoxin -female hormones, like estrogens or progestins and birth control pills, patches, rings, or injections -methadone -midazolam -omeprazole -rifampin -rosuvastatin This list may not describe all possible interactions. Give your health care provider a list of all the medicines, herbs, non-prescription drugs, or dietary supplements you use. Also tell them if you smoke, drink alcohol, or use illegal drugs. Some items may interact with your medicine. What should I watch for while using this medicine? Visit your doctor or health care professional for regular checks on your progress. This medicine may cause weak bones (osteoporosis). Only use this product for the amount of time your health care professional tells you to. The longer you use this product the more likely you will be at risk for weak bones. Ask your health care professional how you can keep strong bones. You may have a change in bleeding pattern or irregular periods. Many females stop having periods while taking this drug. This medicine does not prevent pregnancy. Women must use effective birth control with this medicine. Use a non-hormonal form of birth control while taking this medicine and for 1 week after stopping it. Talk to your health care professional about how to prevent pregnancy. Do not become pregnant while taking this medicine. Women should inform their doctor if they wish to become pregnant or think they might be pregnant. There is a potential for serious side effects to an unborn child. Talk to your health care professional or pharmacist for more information. Patients and their families should watch  out for new or worsening depression or thoughts of suicide. Also watch out for sudden changes in feelings such as feeling anxious, agitated, panicky, irritable, hostile, aggressive, impulsive, severely restless, overly excited and hyperactive, or  not being able to sleep. If this happens, call your health care professional. What side effects may I notice from receiving this medicine? Side effects that you should report to your doctor or health care professional as soon as possible: -allergic reactions like skin rash, itching or hives, swelling of the face, lips, or tongue -anxious -depressed mood -signs and symptoms of liver injury like dark yellow or brown urine; general ill feeling or flu-like symptoms; light-colored stools; loss of appetite; nausea; right upper belly pain; unusually weak or tired; yellowing of the eyes or skin -suicidal thoughts or other mood changes Side effects that usually do not require medical attention (report these to your doctor or health care professional if they continue or are bothersome): -reduced or absent menstrual periods -headache -hot flashes or night sweats -nausea -joint pain -trouble sleeping This list may not describe all possible side effects. Call your doctor for medical advice about side effects. You may report side effects to FDA at 1-800-FDA-1088. Where should I keep my medicine? Keep out of the reach of children. Store at room temperature between 2 and 30 degrees C (36 and 86 degrees F). Throw away any unused medicine after the expiration date on the label. Discard any unused medicine and used packaging carefully. Follow the directions in the MedGuide. Do NOT flush down the toilet. NOTE: This sheet is a summary. It may not cover all possible information. If you have questions about this medicine, talk to your doctor, pharmacist, or health care provider.  2019 Elsevier/Gold Standard (2018-07-01 12:46:01)

## 2018-12-19 NOTE — Telephone Encounter (Signed)
Patient states she needs to schedule an office visit for an exam, but unsure of what kind of exam. States it is not for her pap. Unsure of what kind of appointment to schedule her for.
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
## 2018-12-22 NOTE — Telephone Encounter (Signed)
Left deft detailed message, ok per dpr. Advised returning call in f/u to 2/21 OV. Please return call to office to schedule f/u in 1wk.    Please schedule OV with Dr. Edward Jolly for further discussion of Dewayne Hatch.

## 2018-12-24 ENCOUNTER — Telehealth: Payer: Self-pay | Admitting: Obstetrics and Gynecology
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
## 2018-12-24 NOTE — Telephone Encounter (Signed)
Called [facility] and patient is seen at [facility], [facility].  Phone is [phone], option 5, then option 3.  Left message with request for return call from triage nurse and information from Dr. Edward Jolly.

## 2018-12-24 NOTE — Telephone Encounter (Signed)
Please contact Dr. Nunzio Cobbs, [facility], regarding a mutual patient we have together.   He is treating Lindsey Benson for migraine headaches.   She is having anxiety and vulvar pain, and I would like to start treating her with Cymbalta.  I see a potential interaction with her zonisamide.    I would like his input to see if Cymbalta is contraindicated with zonisamide.  The patient has a follow up appointment with me tomorrow.

## 2018-12-24 NOTE — Telephone Encounter (Signed)
Per review of Epic, patient is scheduled with Dr. Edward Jolly on 2/27 at 8am.   Encounter closed.

## 2018-12-25 ENCOUNTER — Ambulatory Visit (INDEPENDENT_AMBULATORY_CARE_PROVIDER_SITE_OTHER): Admitting: Obstetrics and Gynecology

## 2018-12-25 ENCOUNTER — Encounter: Payer: Self-pay | Admitting: Obstetrics and Gynecology

## 2018-12-25 ENCOUNTER — Telehealth: Payer: Self-pay | Admitting: Obstetrics and Gynecology

## 2018-12-25 VITALS — BP 110/60 | HR 68 | Resp 16 | Ht 64.0 in | Wt 101.0 lb

## 2018-12-25 DIAGNOSIS — R102 Pelvic and perineal pain: Secondary | ICD-10-CM | POA: Diagnosis not present

## 2018-12-25 DIAGNOSIS — N941 Unspecified dyspareunia: Secondary | ICD-10-CM

## 2018-12-25 NOTE — Telephone Encounter (Signed)
Spoke with Corrie Dandy (RN) at [facility].  Dr. Mindi Junker is out of the office at a conference and will return Monday.  She thinks he will check his messages then and then someone from their office will return our call.

## 2018-12-25 NOTE — Telephone Encounter (Signed)
Thank you for the update!

## 2018-12-25 NOTE — Telephone Encounter (Signed)
Left another message with [facility] nurse.

## 2018-12-25 NOTE — Telephone Encounter (Signed)
Please check on savings card/patient suipport for Orilissa 200 mg po bid x 6 months for dyspareunia.   Patient needs to complete a pelvic exam prior to receiving this prescription.   I am currently working to get approval from her Neurologist to use Cymbalta as she is on Zonisamide.   (See separate phone note in Triage.)

## 2018-12-25 NOTE — Patient Instructions (Signed)

## 2018-12-27 DIAGNOSIS — N941 Unspecified dyspareunia: Secondary | ICD-10-CM | POA: Insufficient documentation

## 2018-12-27 DIAGNOSIS — R102 Pelvic and perineal pain unspecified side: Secondary | ICD-10-CM | POA: Insufficient documentation

## 2018-12-30 NOTE — Telephone Encounter (Signed)
Please reach out to [facility] again to receive feedback about prescribing Cymbalta to this patient.

## 2018-12-31 NOTE — Telephone Encounter (Signed)
Left message requesting return call from triage nurse in f/u to recommendations for Cymbalta. Please return call to [facility] at [phone], may ask to speak with Noreene Larsson or [place] Ana.

## 2019-01-01 NOTE — Telephone Encounter (Signed)
Corrie Dandy from [facility] called back. Dr. Mindi Junker saw patient as a sleep medicine patient, not neuro. She recommended I call Dr. Sherrie Mustache office at [facility] as the patient was prescribed by PA Raynelle Highland June 2019.  Call to [facility] [address] [phone].   Spoke with Gene at Dr. Sherrie Mustache office. She will discuss with PA Wilczynski/Dr. Mhoon and call back.

## 2019-01-02 MED ORDER — DULOXETINE HCL 20 MG PO CPEP: 20.0000 mg | ORAL_CAPSULE | Freq: Every day | ORAL | 0 refills | Status: DC

## 2019-01-02 NOTE — Telephone Encounter (Signed)
Left detailed message, ok per dpr. Advised as seen below per Dr. Edward Jolly. Advised Rx for Cymbalta 20 mg #30/0RF to [facility] and [place]. Return call to office to schedule 6 wk f/u with Dr. Edward Jolly.   Encounter closed.

## 2019-01-02 NOTE — Telephone Encounter (Signed)
Reviewed care everywhere at  [facility].  Note from PA: Telephone Encounter - Haskell Flirt, [place] - 01/01/2019 4:19 PM EST Fine to start cymbalta, no interaction with zonisamide  Electronically signed by Haskell Flirt, PA at 01/01/2019 4:19 PM EST

## 2019-01-02 NOTE — Telephone Encounter (Signed)
Please contact patient regarding approval for the Cymbalta and send in Rx for Cymbalta 20 mg daily.  #30, RF one.  She will need a recheck appointment with me in 6 weeks.

## 2019-01-06 ENCOUNTER — Encounter: Payer: Self-pay | Admitting: Obstetrics and Gynecology

## 2019-01-06 ENCOUNTER — Telehealth: Payer: Self-pay | Admitting: Obstetrics and Gynecology

## 2019-01-06 NOTE — Telephone Encounter (Addendum)
Left message to call Noreene Larsson, RN at [facility] [phone].   Call placed to [facility]. Spoke with [employer]. Was advised Cymbalta 20mg  Cap #30/0RF dispensed on 01/05/19. Was advised pharmacist will contact patient to review and confirm RX.   Per review of Rx sent on 01/02/19, no refills sent of Cymbalta 20mg  cap as authorized by Dr. Edward Jolly, see telephone encounter dated 12/24/18. New Rx pended for Cymbalta 20mg  PO daily #30/0RF.

## 2019-01-06 NOTE — Telephone Encounter (Signed)
Thank you for sending in the prescription for the Cymbalta. The voicemail that was left for me specified that I was to take one 20mg  capsule each day, and that the prescription would have 30 capsules. I received 60 capsules, and read that people take around 60mg  for pain. Am I supposed to be taking 2 capsules a day since I have 60, or was that a mistake?

## 2019-01-06 NOTE — Telephone Encounter (Signed)
MyChart message to patient.   Routing pended Rx to Dr. Edward Jolly.

## 2019-01-07 NOTE — Telephone Encounter (Signed)
Spoke with patient. Patient states pharmacy never called her. Confirmed patient is taking one Cymbalta 20 mg capsule daily. Patient states she received 60 capsules, states the bottle has a quantity of 60 printed and there are 60 caps, but the pharmacy label says #30/0RF. Advised patient to f/u with pharmacy regarding RX. Advised will update Dr. Edward Jolly. Patient declines to schedule f/u at this time.   Refill request cancelled at this time.   Routing to Dr. Marjorie Smolder.

## 2019-01-07 NOTE — Telephone Encounter (Signed)
Left message, calling to f/u with cymbalta RX. Please return call to [place], [place] at [facility] [phone].    MyChart message Last read by Sudie Bailey at 9:10 PM on 01/06/2019.

## 2019-01-07 NOTE — Telephone Encounter (Signed)
Left detailed message, ok per dpr. Advised as seen below per Dr. Edward Jolly. Return call to office to schedule OV or if any additional questions.   Encounter closed.

## 2019-01-07 NOTE — Telephone Encounter (Signed)
I recommend starting with Cymbalta 20 mg and have the patient return for a recheck appointment prior to finishing the first month.  We can go up on the dosage once I know if she is tolerating the medication.  This will require an office visit.

## 2019-01-08 NOTE — Telephone Encounter (Signed)
Dr. Edward Jolly,  We currently have [employer] savings cards in the office.  Does this patient still need one?

## 2019-01-08 NOTE — Telephone Encounter (Signed)
Patient needs to complete a pelvic exam prior to receiving a prescription for Orilissa.  She has not completed a pelvic exam to date.  A savings card may be helpful.

## 2019-02-01 ENCOUNTER — Other Ambulatory Visit: Payer: Self-pay | Admitting: Obstetrics and Gynecology

## 2019-02-02 NOTE — Telephone Encounter (Signed)
Medication refill request: Cymbalta  Last AEX:  04/08/18 BS Next AEX: none scheduled  Last MMG (if hormonal medication request): n/a  Refill authorized: Please advise; Order pended for #30 w/0 refills if authorized. Does patient still need f/u appointment scheduled?

## 2019-02-02 NOTE — Telephone Encounter (Signed)
Please schedule a WebEx visit for me to do a follow up visit in 2 weeks regarding Cymbalta.

## 2019-02-11 ENCOUNTER — Other Ambulatory Visit: Payer: Self-pay | Admitting: Obstetrics and Gynecology

## 2019-02-11 NOTE — Telephone Encounter (Signed)
Medication refill request: Norlyda Last AEX:  04/08/18 Dr. Edward Jolly  Next AEX: none Last MMG (if hormonal medication request): none Refill authorized: 06/13/18 #[id number]. Today please advise

## 2019-03-05 ENCOUNTER — Telehealth: Payer: Self-pay | Admitting: Obstetrics and Gynecology

## 2019-03-05 ENCOUNTER — Encounter: Payer: Self-pay | Admitting: Obstetrics and Gynecology

## 2019-03-05 NOTE — Telephone Encounter (Signed)
Routing to Dr. Silva to review and advise.  

## 2019-03-05 NOTE — Telephone Encounter (Signed)
Message   Hello, I was prescribed Cymbalta with one refill. I was instructed to make a follow up appointment before being able to get another refill. I have not had any negative side effects with this medication. Given the COVID-19 circumstances, would I be able to get one more refill before making a follow up appointment. I do not want to have to abruptly stop this medication, and I do not want to make an appointment yet because of the virus.    Thank you

## 2019-03-05 NOTE — Telephone Encounter (Signed)
Please schedule a WebEx visit with me and let me know if she will run out of the Cymbalta before the WebEx.  I don't want her to run out before the WebEx visit!

## 2019-03-06 NOTE — Telephone Encounter (Addendum)
Left detailed message, ok per dpr. Advised WebEx visit recommended with Dr. Edward Jolly, please return call to office to schedule.   MyChart message to patient.

## 2019-03-11 ENCOUNTER — Other Ambulatory Visit: Payer: Self-pay | Admitting: Obstetrics and Gynecology

## 2019-03-11 ENCOUNTER — Encounter: Payer: Self-pay | Admitting: Obstetrics and Gynecology

## 2019-03-11 NOTE — Telephone Encounter (Signed)
Medication refill request: cymbalta 20mg  Last AEX:  04-08-18 Next AEX: not scheduled Last MMG (if hormonal medication request): none Refill authorized: please approve if appropriate

## 2019-03-11 NOTE — Telephone Encounter (Signed)
Please have patient schedule a Web Ex or office visit to discuss the Cymbalta prescription.

## 2019-03-13 NOTE — Telephone Encounter (Signed)
Please see Mychart message sent on 03/11/2019 as well. Patient wants to combine her recheck with her pelvic exam, but has questions about insurance coverage.  Cc: Soledad Gerlach, Shari Prows, RN

## 2019-03-13 NOTE — Telephone Encounter (Signed)
Routing to [employer], Harland Dingwall, and Billie Ruddy for review.

## 2019-03-19 NOTE — Telephone Encounter (Signed)
Patient states she has 3 weeks of her medication left, she will be back in town in 2 days and states she will contact her insurance company to go over coverage. Advised patient she needs to schedule appointment for follow up before she runs out of the medication. Patient has annual exam scheduled for 04/09/2019. Patient agreeable.   Routing to provider and will close encounter.

## 2019-03-19 NOTE — Telephone Encounter (Signed)
Routing to [employer].

## 2019-03-19 NOTE — Telephone Encounter (Signed)
See telephone encounter dated 03/05/19.   Routing to Dr. Edward Jolly  Encounter closed.

## 2019-03-24 ENCOUNTER — Telehealth: Payer: Self-pay | Admitting: Obstetrics and Gynecology

## 2019-03-24 ENCOUNTER — Encounter: Payer: Self-pay | Admitting: Obstetrics and Gynecology

## 2019-03-24 NOTE — Telephone Encounter (Signed)
Routed to Freddrick March and Suzy--this is continuation from previous messages.

## 2019-03-24 NOTE — Telephone Encounter (Signed)
Patient sent the following correspondence through MyChart. Routing to triage to assist patient with request.  I found out that the pelvic exam will not be covered using the referral I currently have on file. I am working with my primary care manager to try to get her to give me another referral or add to my existing one. I had a feeling this might be the case, so I asked her to add the exam to my referral already. She refused and told me I would be required [place]. Seeing a new doctor for an exam like this makes me extremely uncomfortable, so I am making every effort to get my primary care manager to change my referral. Thanks!

## 2019-03-27 MED ORDER — DULOXETINE HCL 20 MG PO CPEP: 20.0000 mg | ORAL_CAPSULE | Freq: Every day | ORAL | 0 refills | Status: DC

## 2019-03-27 NOTE — Addendum Note (Signed)
Addended by: Leda Min on: 03/27/2019 02:26 PM   Modules accepted: Orders

## 2019-03-27 NOTE — Telephone Encounter (Signed)
Rx for Cymbalta to pharmacy on file.   MyChart message to patient to notify.

## 2019-03-27 NOTE — Telephone Encounter (Signed)
I will refill another 30 days of her Cymbalta while she is working on her referral to return to the office.

## 2019-04-08 NOTE — Telephone Encounter (Signed)
See previous My Chart messages and phone notes.  Appears patient was in the process of obtaining a referral from her insurance company to have a pelvic exam in our office. Call placed to patient to follow up and to verify scheduling needs. Unable to leave a voicemail message, as patients voice mailbox was full.   cc: Lamont Snowball, RN  cc: Thayer Ohm

## 2019-04-09 NOTE — Telephone Encounter (Signed)
Patient returning call. Would like to schedule pelvic exam and med check within the next month, as she will be out of the medication by then.

## 2019-04-09 NOTE — Telephone Encounter (Signed)
Call returned to patient. After reviewing scheduling options with nurse supervisor, patient is scheduled for an appointment with pelvic exam on 04/24/2019 at 11:30 with Dr Quincy Simmonds. Patient is aware of appointment date, arrival time. Patient advised to wear a face covering throughout appointment and patient is aware of no visitor policy, in place at this time.   Routing to Dr Quincy Simmonds for final review. Patient is agreeable to disposition. Will close encounter  cc: Lamont Snowball, RN

## 2019-04-10 ENCOUNTER — Telehealth: Payer: Self-pay

## 2019-04-10 NOTE — Telephone Encounter (Signed)
Spoke with patient. She is interested in starting [place]. Advised patient she will need pelvic exam before Dr.Silva will prescribe. She has appointment 04-24-19. Advised patient to keep that appointment.

## 2019-04-10 NOTE — Telephone Encounter (Signed)
Called patient to make Wed-Ex appointment. Voicemail is full, unable to leave a message.

## 2019-04-13 ENCOUNTER — Ambulatory Visit: Admitting: Obstetrics and Gynecology

## 2019-04-20 ENCOUNTER — Telehealth: Payer: Self-pay | Admitting: Obstetrics and Gynecology

## 2019-04-20 NOTE — Telephone Encounter (Signed)
Patient is calling regarding her appointment that is scheduled for Friday. Patient does not feel comfortable with having a pelvic exam without someone being with her.

## 2019-04-20 NOTE — Telephone Encounter (Signed)
Spoke with patient. Patient states she is scheduled for OV on 6/26 with Dr. Quincy Simmonds to further discuss Freida Busman. Patient is requesting visitor to accompany her to her OV. Patient is aware [place] is still under a no visitor policy, unable to allow visit to accompany to OV, offered to reschedule to later date. Patient request WebEx for cymbalta med f/u with Dr. Quincy Simmonds. WebEx scheduled for 6/30 at 11:30am with Dr. Quincy Simmonds. Email on file confirmed. Patient will reschedule OV at a later date. Advised I will update Dr. Quincy Simmonds, our office will return call if any additional recommendations. Patient agreeable.   Routing to provider for final review. Patient is agreeable to disposition. Will close encounter.

## 2019-04-24 ENCOUNTER — Ambulatory Visit: Admitting: Obstetrics and Gynecology

## 2019-04-28 ENCOUNTER — Encounter: Payer: Self-pay | Admitting: Obstetrics and Gynecology

## 2019-04-28 ENCOUNTER — Other Ambulatory Visit: Payer: Self-pay | Admitting: Obstetrics and Gynecology

## 2019-04-28 ENCOUNTER — Other Ambulatory Visit: Payer: Self-pay

## 2019-04-28 ENCOUNTER — Telehealth (INDEPENDENT_AMBULATORY_CARE_PROVIDER_SITE_OTHER): Admitting: Obstetrics and Gynecology

## 2019-04-28 DIAGNOSIS — R102 Pelvic and perineal pain: Secondary | ICD-10-CM | POA: Diagnosis not present

## 2019-04-28 DIAGNOSIS — N946 Dysmenorrhea, unspecified: Secondary | ICD-10-CM

## 2019-04-28 DIAGNOSIS — N941 Unspecified dyspareunia: Secondary | ICD-10-CM | POA: Diagnosis not present

## 2019-04-28 MED ORDER — DULOXETINE HCL 30 MG PO CPEP: 30.0000 mg | ORAL_CAPSULE | Freq: Every day | ORAL | 1 refills | Status: AC

## 2019-04-28 MED ORDER — NORETHINDRONE 0.35 MG PO TABS: ORAL_TABLET | ORAL | 1 refills | Status: DC
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
## 2019-06-09 ENCOUNTER — Encounter: Payer: Self-pay | Admitting: Obstetrics and Gynecology

## 2019-06-10 ENCOUNTER — Other Ambulatory Visit: Payer: Self-pay | Admitting: Obstetrics and Gynecology

## 2019-06-10 NOTE — Telephone Encounter (Signed)
Patient sent the following correspondence through [place].  I have now moved from [place], [place] to [place], [place]. Is it possible to have my medications sent to a different [facility]? Could I have them sent to the [facility] [place] [address]?    Thank you

## 2019-06-10 NOTE — Telephone Encounter (Signed)
Called the patient and informed her to call her pharmacy and ask them to transfer her medication to [place]

## 2019-09-20 ENCOUNTER — Other Ambulatory Visit: Payer: Self-pay | Admitting: Obstetrics and Gynecology

## 2019-10-16 ENCOUNTER — Other Ambulatory Visit: Payer: Self-pay | Admitting: Obstetrics and Gynecology

## 2019-10-24 ENCOUNTER — Encounter: Payer: Self-pay | Admitting: Obstetrics and Gynecology

## 2019-10-26 ENCOUNTER — Other Ambulatory Visit: Payer: Self-pay | Admitting: Obstetrics and Gynecology

## 2019-10-26 DIAGNOSIS — N946 Dysmenorrhea, unspecified: Secondary | ICD-10-CM

## 2019-10-26 NOTE — Telephone Encounter (Signed)
Patient sent the following correspondence through [place].  Hello,  I visited for an annual appointment so that I  could get my birth control for a whole year after moving to [place]. I  believe I  had this appointment last summer. [facility] is saying that my birth control prescription is being rejected, so they cannot refill it. Can you authorize it so that I  can continue to take my medication? Thank you and happy holidays

## 2019-10-26 NOTE — Telephone Encounter (Signed)
Left message for pt to call back to triage RN.  

## 2019-10-27 NOTE — Telephone Encounter (Signed)
Left message for pt to call back to triage RN Colletta Maryland to discuss refill

## 2019-10-27 NOTE — Telephone Encounter (Signed)
Spoke to pt. Pt needing RF on POPs for birth control. Pt moved to [place] and received an email from pharmacy stating her Rx was refused. Pt had last AEX in 03/2018. Had virtual visit on 04/28/19 with Dr Quincy Simmonds.   Med refill request:Norlyda Last AEX:  Next AEX: not scheduled with [facility] due to moved to [place].  Last MMG (if hormonal med) n/a Refill authorized: #84, 1 RF. Orders pended if approved.   Will route to Dr Quincy Simmonds for recommendations and refill request.

## 2019-10-28 MED ORDER — NORETHINDRONE 0.35 MG PO TABS: ORAL_TABLET | ORAL | 0 refills | Status: AC

## 2019-10-28 NOTE — Telephone Encounter (Signed)
Please let patient know that I will refill her Micronor for 3 months and her Cymbalta for 3 months if she needs it.  She is overdue for an annual exam and will need this for me to continue prescribing both of these.  I extended her prescriptions until December as she was moving away and I did not want her to run out.  I am happy to see her here for a visit if she is returning to the [place].   I hope school is going well for her!

## 2019-10-29 NOTE — Telephone Encounter (Signed)
Attempted to call pt. Voice mail box full and couldn't leave message. Will call back on 11/02/2019.

## 2019-12-02 ENCOUNTER — Telehealth: Payer: Self-pay

## 2019-12-02 NOTE — Telephone Encounter (Signed)
Opened in error. Encounter closed.

## 2019-12-23 IMAGING — MR MR HEAD WO/W CM
11 of 16 series · 29 of 48 positions shown · IV contrast (Yes)
Comparison: None.

CLINICAL DATA: 22 y/o F; headache, numbness or tingling,
paresthesia.

EXAM:
MRI HEAD WITHOUT AND WITH CONTRAST
TECHNIQUE: Multiplanar, multiecho pulse sequences of the brain and surrounding
structures were obtained without and with intravenous contrast.
CONTRAST:  10mL MULTIHANCE GADOBENATE DIMEGLUMINE 529 MG/ML IV SOLN

[Series 4: DWI · axial · 3.0mm · 1.09mm/px · z∈[-37,+96]mm · 6 of 92 slices shown (1 of 4)]
[im 1/92]
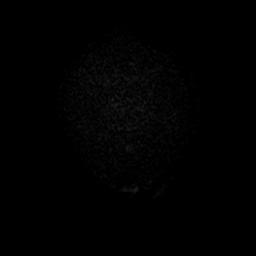
[im 19/92]
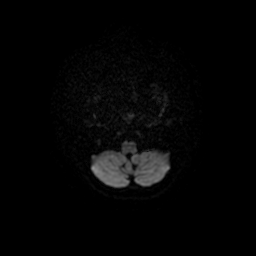
[im 37/92]
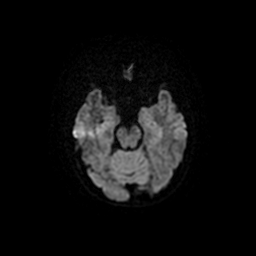
[im 55/92]
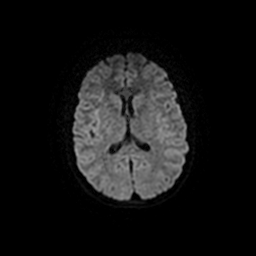
[im 73/92]
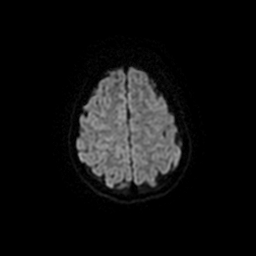
[im 92/92]
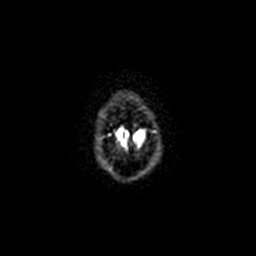

[Series 5: T1 · sagittal · 5.0mm · 0.47mm/px · 1 of 24 slices shown]
[im 1/24]
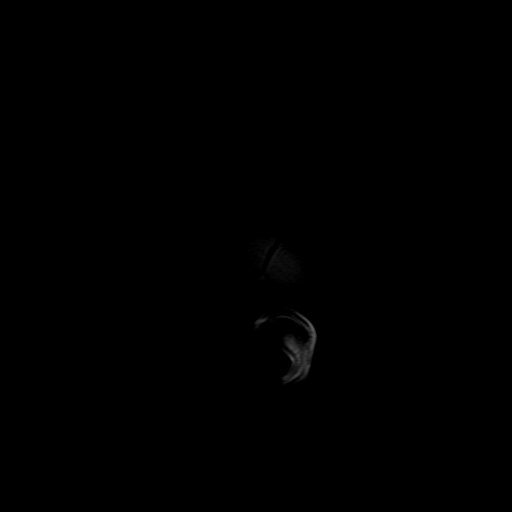

[Series 6: DWI · coronal · 5.0mm · 1.09mm/px · 4 of 60 slices shown (2 of 4)]
[im 1/60]
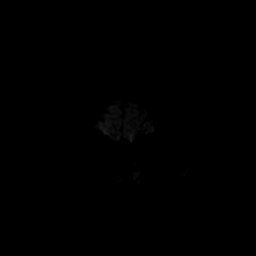
[im 20/60]
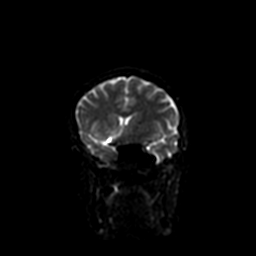
[im 40/60]
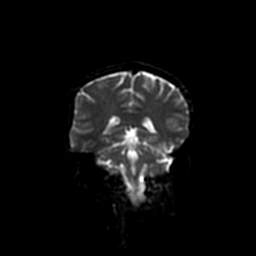
[im 60/60]
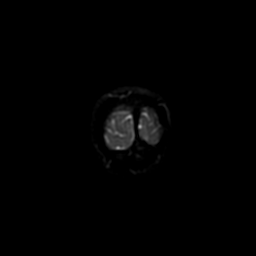

[Series 7: FLAIR · sagittal · 1.6mm · 0.47mm/px · 8 of 176 slices shown (1 of 2)]
[im 1/176]
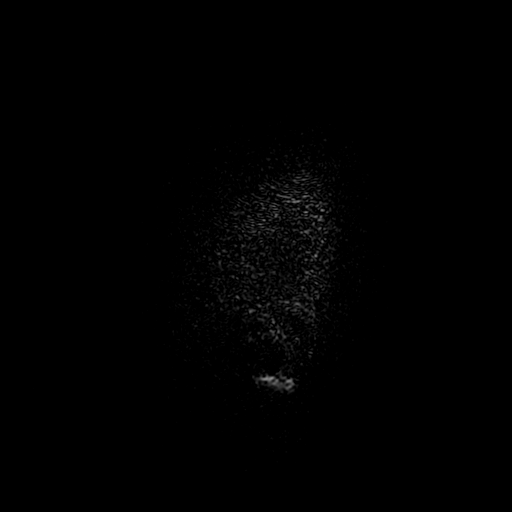
[im 20/176]
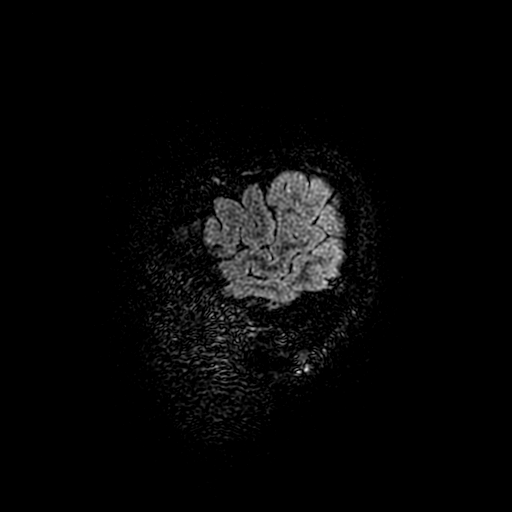
[im 59/176]
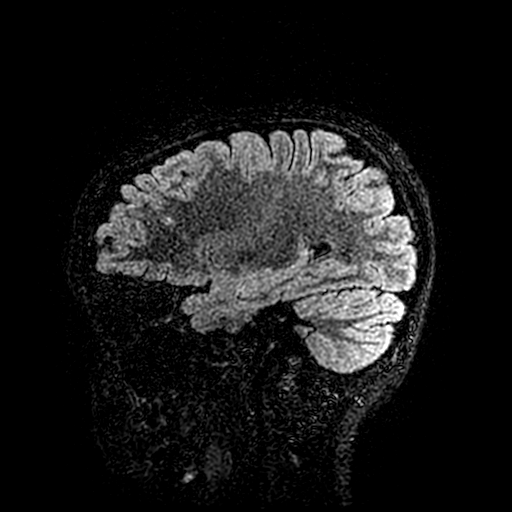
[im 78/176]
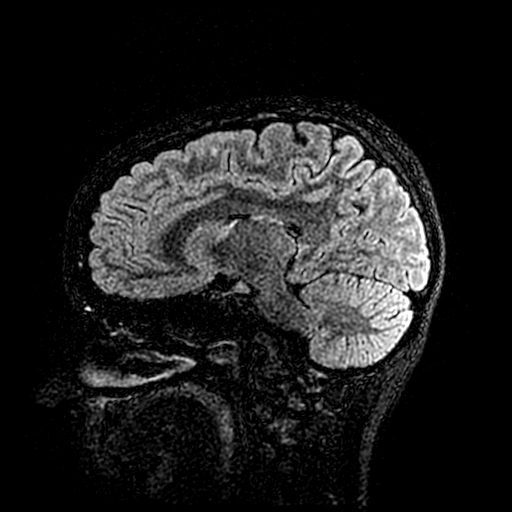
[im 98/176]
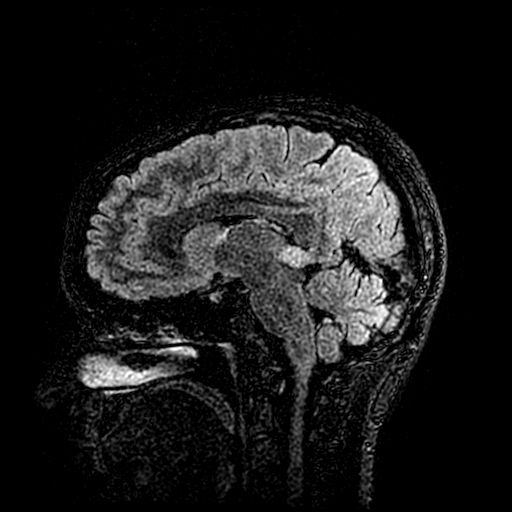
[im 117/176]
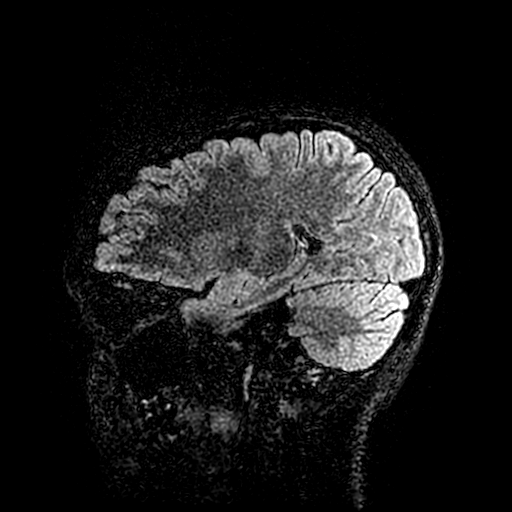
[im 156/176]
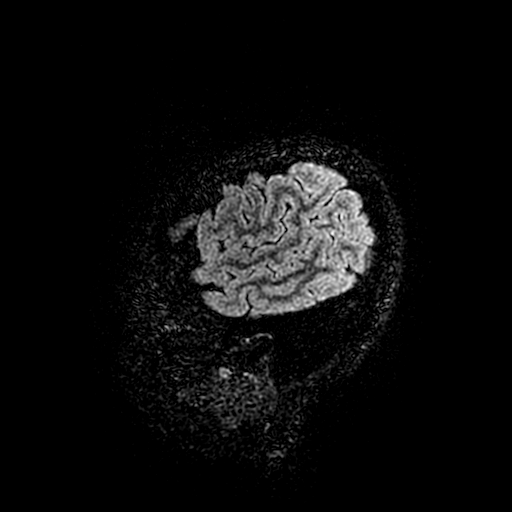
[im 176/176]
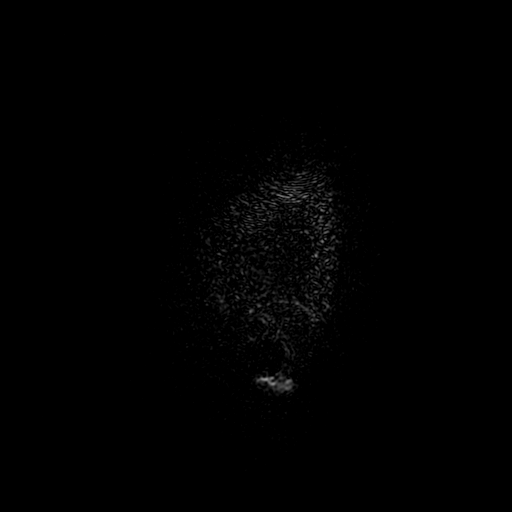

[Series 8: T2 · axial · 5.0mm · 0.43mm/px · 1 of 24 slices shown]
[im 1/24]
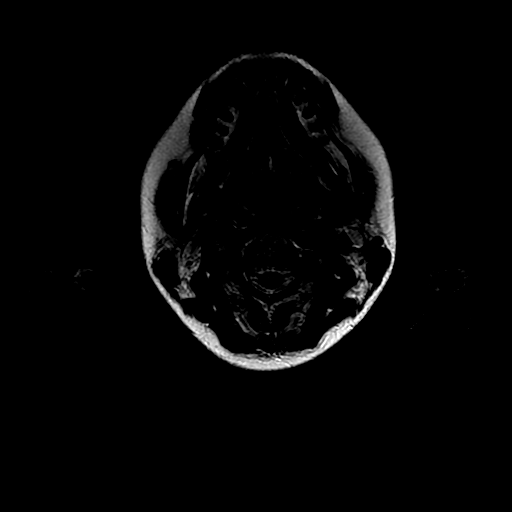

[Series 9: FLAIR · axial · 3.0mm · 0.43mm/px · 1 of 24 slices shown (2 of 2)]
[im 1/24]
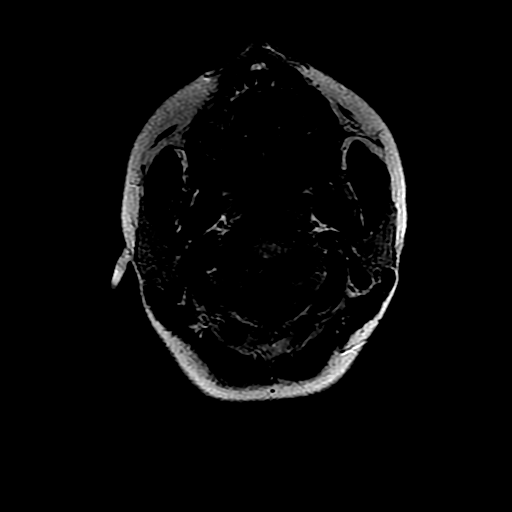

[Series 12: T2 post-contrast · coronal · 5.0mm · 0.47mm/px · 1 of 24 slices shown]
[im 1/24]
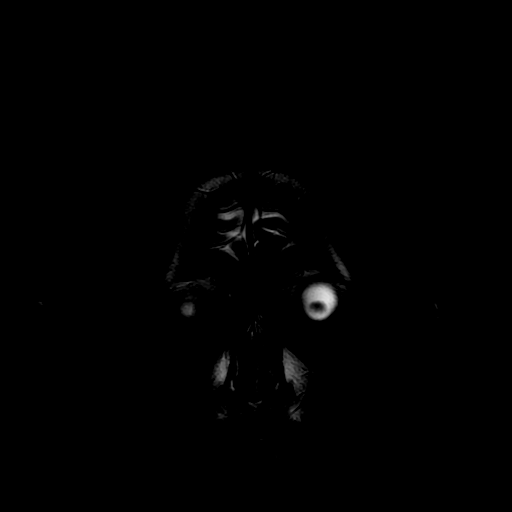

[Series 14: T1 post-contrast · sagittal · 5.0mm · 0.47mm/px · 1 of 24 slices shown (1 of 2)]
[im 1/24]
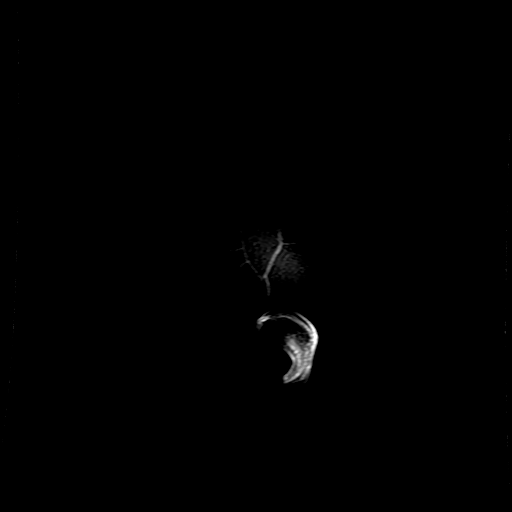

[Series 15: T1 post-contrast · coronal · 5.0mm · 0.47mm/px · 1 of 24 slices shown (2 of 2)]
[im 1/24]
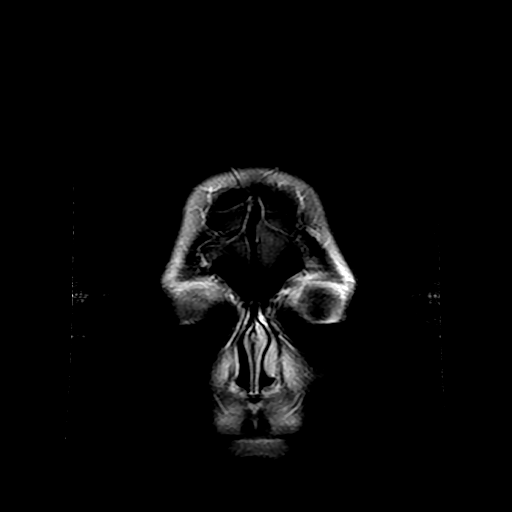

[Series 400: DWI · axial · 3.0mm · 1.09mm/px · z∈[-37,+96]mm · 3 of 46 slices shown (3 of 4)]
[im 1/46]
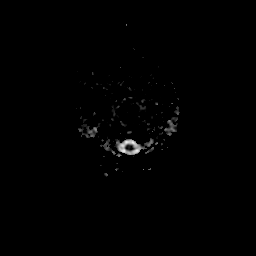
[im 23/46]
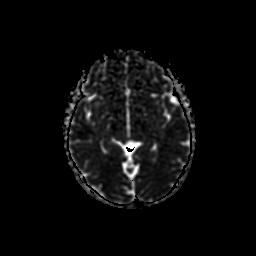
[im 46/46]
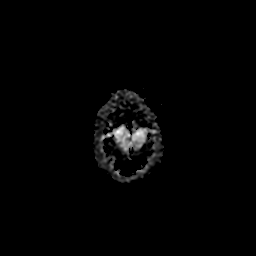

[Series 600: DWI · coronal · 5.0mm · 1.09mm/px · 2 of 30 slices shown (4 of 4)]
[im 1/30]
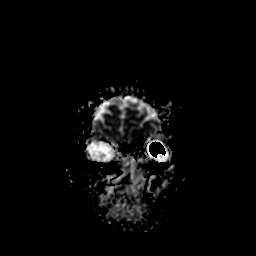
[im 30/30]
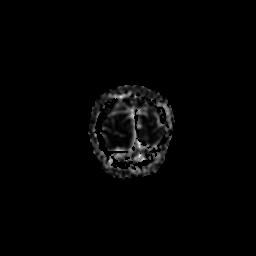

[29 of 48 positions shown; findings below may reference images not displayed]

FINDINGS: Brain: Pineal cyst measuring 17 x 16 x 12 mm (AP x ML x CC series 7,
image 91 and series 8, image 14) within enhancing wall, no nodular
components. 4 small focus of T2 FLAIR hyperintense signal
abnormality in bilateral frontal subcortical as well as juxta
cortical white matter and right periatrial white matter (series 7:
Image 59, series 9: Image 16 and 21, and series 9, image 12). No
lesion identified within corpus callosum, basal ganglia, or
posterior fossa.

No abnormal enhancement. No reduced diffusion to suggest acute or
early subacute infarction. No susceptibility hypointensity to
indicate intracranial hemorrhage. No hydrocephalus, extra-axial
collection, or effacement of basilar cisterns.

Vascular: Normal flow voids.

Skull and upper cervical spine: Normal marrow signal.

Sinuses/Orbits: Negative.

Other: None.
IMPRESSION: 1. 4 small nonspecific white matter lesions in juxta cortical,
subcortical, and periventricular white matter unexpected for age. No
enhancement or reduced diffusion to suggest an active process.
Findings may represent multiple sclerosis, but do not satisfy
revised [REDACTED] criteria for multiple sclerosis without
enhancement. Differential includes sequelae of migraine headache or
other causes of inflammation such as NMO, Lyme disease, lupus, or
vasculitis. Follow-up is recommended.
2. 17 mm pineal cyst. One year follow-up with MRI of the brain is
recommended to ensure stability.

By: Cruys M.D.

## 2020-02-20 ENCOUNTER — Other Ambulatory Visit: Payer: Self-pay | Admitting: Obstetrics and Gynecology

## 2020-02-20 DIAGNOSIS — N946 Dysmenorrhea, unspecified: Secondary | ICD-10-CM

## 2023-10-14 ENCOUNTER — Other Ambulatory Visit: Payer: Self-pay | Admitting: Medical Genetics

## 2024-02-07 ENCOUNTER — Other Ambulatory Visit: Payer: Self-pay

## 2024-02-07 DIAGNOSIS — Z006 Encounter for examination for normal comparison and control in clinical research program: Secondary | ICD-10-CM

## 2024-02-16 LAB — GENECONNECT MOLECULAR SCREEN: Genetic Analysis Overall Interpretation: NEGATIVE
